# Patient Record
Sex: Female | Born: 1945 | Race: White | Hispanic: No | Smoking: Former smoker
Health system: Southern US, Community
[De-identification: ages and names within clinical notes are randomized; demographics above are authoritative.]

## PROBLEM LIST (undated history)

## (undated) DIAGNOSIS — I10 Essential (primary) hypertension: Secondary | ICD-10-CM

## (undated) DIAGNOSIS — K219 Gastro-esophageal reflux disease without esophagitis: Secondary | ICD-10-CM

## (undated) DIAGNOSIS — R7303 Prediabetes: Secondary | ICD-10-CM

## (undated) DIAGNOSIS — Z9889 Other specified postprocedural states: Secondary | ICD-10-CM

## (undated) DIAGNOSIS — R519 Headache, unspecified: Secondary | ICD-10-CM

## (undated) DIAGNOSIS — R531 Weakness: Secondary | ICD-10-CM

## (undated) DIAGNOSIS — K449 Diaphragmatic hernia without obstruction or gangrene: Secondary | ICD-10-CM

## (undated) DIAGNOSIS — E559 Vitamin D deficiency, unspecified: Secondary | ICD-10-CM

## (undated) DIAGNOSIS — F320 Major depressive disorder, single episode, mild: Secondary | ICD-10-CM

## (undated) DIAGNOSIS — L409 Psoriasis, unspecified: Secondary | ICD-10-CM

## (undated) DIAGNOSIS — G3184 Mild cognitive impairment, so stated: Secondary | ICD-10-CM

## (undated) DIAGNOSIS — N3946 Mixed incontinence: Secondary | ICD-10-CM

## (undated) DIAGNOSIS — Z862 Personal history of diseases of the blood and blood-forming organs and certain disorders involving the immune mechanism: Secondary | ICD-10-CM

## (undated) DIAGNOSIS — M792 Neuralgia and neuritis, unspecified: Secondary | ICD-10-CM

## (undated) DIAGNOSIS — E7800 Pure hypercholesterolemia, unspecified: Secondary | ICD-10-CM

## (undated) DIAGNOSIS — F988 Other specified behavioral and emotional disorders with onset usually occurring in childhood and adolescence: Secondary | ICD-10-CM

## (undated) DIAGNOSIS — R413 Other amnesia: Secondary | ICD-10-CM

## (undated) HISTORY — DX: Diaphragmatic hernia without obstruction or gangrene: K44.9

## (undated) HISTORY — DX: Gastro-esophageal reflux disease without esophagitis: K21.9

## (undated) HISTORY — DX: Other specified behavioral and emotional disorders with onset usually occurring in childhood and adolescence: F98.8

## (undated) HISTORY — DX: Headache, unspecified: R51.9

## (undated) HISTORY — DX: Vitamin D deficiency, unspecified: E55.9

## (undated) HISTORY — DX: Major depressive disorder, single episode, mild: F32.0

## (undated) HISTORY — DX: Essential (primary) hypertension: I10

## (undated) HISTORY — DX: Prediabetes: R73.03

## (undated) HISTORY — DX: Morbid (severe) obesity due to excess calories: E66.01

## (undated) HISTORY — DX: Mild cognitive impairment of uncertain or unknown etiology: G31.84

## (undated) HISTORY — DX: Mixed incontinence: N39.46

## (undated) HISTORY — DX: Psoriasis, unspecified: L40.9

## (undated) HISTORY — DX: Neuralgia and neuritis, unspecified: M79.2

## (undated) HISTORY — DX: Pure hypercholesterolemia, unspecified: E78.00

## (undated) HISTORY — DX: Weakness: R53.1

## (undated) HISTORY — DX: Personal history of diseases of the blood and blood-forming organs and certain disorders involving the immune mechanism: Z86.2

## (undated) HISTORY — DX: Other amnesia: R41.3

## (undated) HISTORY — DX: Other specified postprocedural states: Z98.890

---

## 2004-09-01 ENCOUNTER — Ambulatory Visit: Payer: Self-pay | Admitting: Family Medicine

## 2005-12-03 ENCOUNTER — Ambulatory Visit: Payer: Self-pay | Admitting: Gastroenterology

## 2006-01-29 ENCOUNTER — Ambulatory Visit: Payer: Self-pay | Admitting: Neurology

## 2006-05-04 ENCOUNTER — Ambulatory Visit (HOSPITAL_COMMUNITY): Admission: RE | Admit: 2006-05-04 | Discharge: 2006-05-05 | Payer: Self-pay | Admitting: Neurosurgery

## 2007-06-07 ENCOUNTER — Ambulatory Visit: Payer: Self-pay | Admitting: Family Medicine

## 2007-06-10 ENCOUNTER — Encounter: Admission: RE | Admit: 2007-06-10 | Discharge: 2007-06-10 | Payer: Self-pay | Admitting: Neurosurgery

## 2007-08-11 ENCOUNTER — Ambulatory Visit: Payer: Self-pay | Admitting: Family Medicine

## 2010-08-14 ENCOUNTER — Ambulatory Visit: Payer: Self-pay | Admitting: Family Medicine

## 2010-11-28 NOTE — Op Note (Signed)
NAMEJohnette Griffith           ACCOUNT NO.:  [id number]   MEDICAL RECORD NO.:  [id number]          PATIENT TYPE:  AMB   LOCATION:  SDS                          FACILITY:  [facility]   PHYSICIAN:  Reinaldo Meeker, M.D. 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   PROCEDURE:  C3-4 and C4-5 anterior cervical diskectomy with bone bank fusion  followed by Accufix anterior cervical plating.   SURGEON:  Reinaldo Meeker, M.D.   ASSISTANT:  Julio Sicks, M.D.   PROCEDURE IN DETAIL:  After being placed in the supine position and 5 pounds  of halter traction, the patient's neck was prepped and draped in the usual  sterile fashion.  Localizing fluoroscopy was used prior to incision to  identify the appropriate level.  A transverse incision was made in the right  anterior neck starting at the midline and headed towards the medial aspect  of the sternocleidomastoid muscle.  The platysma muscle was then incised  transversely.  The natural fascial plane between the strap muscles medially  and sternocleidomastoid laterally was identified and followed down to the  anterior aspect of the cervical spine.  Longus coli muscles were identified,  split the midline and stripped away bilaterally with Building surveyor.  A second x-ray showed approach to the appropriate level.  Using the 15 blade, the annuli of disks at C3-4 and C4-5 were incised.  Using pituitary rongeurs and curettes, approximately 90% of the disk  material was removed.  A high-speed drill was used to widen the interspace  at both levels.  Microscope was draped, brought into the field and used for  the remainder of the case.  Using microsection technique, the remainder  of  the disk material along the posterior longitudinal ligament was removed.  The ligament was then incised transversely and the cut edges were removed  with a Kerrison punch.  Thorough spinal decompression was carried out as  well as proximal foraminal decompression bilaterally.  Attention was then  turned to C4-5, where a similar decompression was completed.  At this time,  inspection was carried out at both levels for any evidence of residual  compression and none could be identified.  Spinal dura was well  decompressed; proximal foraminal nerve roots were identified as well.  At  this time, large amounts of irrigation were carried out and any bleeding  controlled with bipolar coagulation and Gelfoam.  Measurements were taken  and 7-mm and 6-mm bone bank plugs were reconstituted.  After irrigating once  more and confirming hemostasis, the plugs were impacted with the 7-mm plug  at C3-4 and the 6-mm plug at C4-5.  Fluoroscopy showed them to be in good  position.  An appropriate-length Accufix anterior cervical plate was then  chosen.  Under fluoroscopic guidance, drill holes were placed followed by  placement of 13-mm  screws x6.  Locking mechanism was well secured and  fluoroscopy showed the plate, screws and plugs to all be in good position.  Large amounts of irrigation were carried out and any bleeding at this time  was controlled with bipolar coagulation.  The wound was then closed  using interrupted Vicryl on the platysma muscle, inverted 5-0 PDS on the  subcuticular layer and Steri-Strips on the skin.  A sterile dressing was  then applied and the patient was extubated and taken to the recovery room in  stable condition.           ______________________________  Reinaldo Meeker, M.D.     ROK/MEDQ  D:  05/04/2006  T:  05/05/2006  Job:  [id number]

## 2011-03-31 DIAGNOSIS — I10 Essential (primary) hypertension: Secondary | ICD-10-CM | POA: Insufficient documentation

## 2011-03-31 DIAGNOSIS — E559 Vitamin D deficiency, unspecified: Secondary | ICD-10-CM | POA: Insufficient documentation

## 2011-10-13 DIAGNOSIS — Z96659 Presence of unspecified artificial knee joint: Secondary | ICD-10-CM | POA: Insufficient documentation

## 2012-11-03 DIAGNOSIS — Z862 Personal history of diseases of the blood and blood-forming organs and certain disorders involving the immune mechanism: Secondary | ICD-10-CM | POA: Insufficient documentation

## 2012-11-03 DIAGNOSIS — D649 Anemia, unspecified: Secondary | ICD-10-CM | POA: Insufficient documentation

## 2015-09-04 DIAGNOSIS — E7800 Pure hypercholesterolemia, unspecified: Secondary | ICD-10-CM | POA: Insufficient documentation

## 2015-09-06 ENCOUNTER — Other Ambulatory Visit: Payer: Self-pay | Admitting: Family Medicine

## 2015-09-06 DIAGNOSIS — Z1231 Encounter for screening mammogram for malignant neoplasm of breast: Secondary | ICD-10-CM

## 2015-09-06 DIAGNOSIS — Z780 Asymptomatic menopausal state: Secondary | ICD-10-CM

## 2015-09-11 ENCOUNTER — Ambulatory Visit
Admission: RE | Admit: 2015-09-11 | Discharge: 2015-09-11 | Disposition: A | Discharge status: Home / Self Care | Payer: Medicare Other | Source: Ambulatory Visit | Attending: Family Medicine | Admitting: Family Medicine

## 2015-09-11 ENCOUNTER — Ambulatory Visit: Payer: Self-pay

## 2015-09-11 DIAGNOSIS — Z1231 Encounter for screening mammogram for malignant neoplasm of breast: Secondary | ICD-10-CM | POA: Insufficient documentation

## 2015-09-11 DIAGNOSIS — Z780 Asymptomatic menopausal state: Secondary | ICD-10-CM | POA: Insufficient documentation

## 2015-09-11 DIAGNOSIS — N63 Unspecified lump in breast: Secondary | ICD-10-CM | POA: Diagnosis not present

## 2015-09-18 ENCOUNTER — Other Ambulatory Visit: Payer: Self-pay | Admitting: Family Medicine

## 2015-09-18 DIAGNOSIS — R928 Other abnormal and inconclusive findings on diagnostic imaging of breast: Secondary | ICD-10-CM

## 2015-09-20 ENCOUNTER — Ambulatory Visit
Admission: RE | Admit: 2015-09-20 | Discharge: 2015-09-20 | Disposition: A | Discharge status: Home / Self Care | Payer: Medicare Other | Source: Ambulatory Visit | Attending: Family Medicine | Admitting: Family Medicine

## 2015-09-20 DIAGNOSIS — N63 Unspecified lump in breast: Secondary | ICD-10-CM | POA: Diagnosis not present

## 2015-09-20 DIAGNOSIS — R928 Other abnormal and inconclusive findings on diagnostic imaging of breast: Secondary | ICD-10-CM

## 2016-11-23 DIAGNOSIS — J3089 Other allergic rhinitis: Secondary | ICD-10-CM | POA: Insufficient documentation

## 2016-12-24 DIAGNOSIS — K449 Diaphragmatic hernia without obstruction or gangrene: Secondary | ICD-10-CM | POA: Insufficient documentation

## 2017-07-02 DIAGNOSIS — B001 Herpesviral vesicular dermatitis: Secondary | ICD-10-CM | POA: Insufficient documentation

## 2017-12-14 DIAGNOSIS — F320 Major depressive disorder, single episode, mild: Secondary | ICD-10-CM | POA: Insufficient documentation

## 2018-12-19 DIAGNOSIS — N3941 Urge incontinence: Secondary | ICD-10-CM | POA: Insufficient documentation

## 2019-07-21 DIAGNOSIS — M792 Neuralgia and neuritis, unspecified: Secondary | ICD-10-CM | POA: Insufficient documentation

## 2019-09-19 DIAGNOSIS — G3184 Mild cognitive impairment, so stated: Secondary | ICD-10-CM | POA: Insufficient documentation

## 2020-09-06 DIAGNOSIS — R519 Headache, unspecified: Secondary | ICD-10-CM | POA: Insufficient documentation

## 2020-09-06 DIAGNOSIS — R531 Weakness: Secondary | ICD-10-CM | POA: Insufficient documentation

## 2020-09-11 ENCOUNTER — Other Ambulatory Visit: Payer: Self-pay | Admitting: Neurology

## 2020-09-11 DIAGNOSIS — R413 Other amnesia: Secondary | ICD-10-CM

## 2020-09-11 DIAGNOSIS — R519 Headache, unspecified: Secondary | ICD-10-CM

## 2020-09-11 DIAGNOSIS — R531 Weakness: Secondary | ICD-10-CM

## 2020-09-24 ENCOUNTER — Ambulatory Visit
Admission: RE | Admit: 2020-09-24 | Discharge: 2020-09-24 | Disposition: A | Discharge status: Home / Self Care | Payer: Medicare HMO | Source: Ambulatory Visit | Attending: Neurology | Admitting: Neurology

## 2020-09-24 ENCOUNTER — Other Ambulatory Visit: Payer: Self-pay

## 2020-09-24 DIAGNOSIS — R519 Headache, unspecified: Secondary | ICD-10-CM

## 2020-09-24 DIAGNOSIS — R413 Other amnesia: Secondary | ICD-10-CM

## 2020-09-27 ENCOUNTER — Ambulatory Visit: Payer: Self-pay

## 2020-11-04 DIAGNOSIS — F39 Unspecified mood [affective] disorder: Secondary | ICD-10-CM | POA: Insufficient documentation

## 2020-12-02 DIAGNOSIS — F988 Other specified behavioral and emotional disorders with onset usually occurring in childhood and adolescence: Secondary | ICD-10-CM | POA: Insufficient documentation

## 2020-12-02 DIAGNOSIS — M170 Bilateral primary osteoarthritis of knee: Secondary | ICD-10-CM | POA: Insufficient documentation

## 2020-12-02 DIAGNOSIS — M2550 Pain in unspecified joint: Secondary | ICD-10-CM | POA: Insufficient documentation

## 2020-12-02 DIAGNOSIS — L409 Psoriasis, unspecified: Secondary | ICD-10-CM | POA: Insufficient documentation

## 2021-05-29 IMAGING — MR MR HEAD W/O CM
11 series · 45 of 48 positions shown · non-contrast
Comparison: Head CT 06/07/2007.  Brain MRI 02/04/2006.

CLINICAL DATA: Intractable headache, unspecified chronicity
pattern, unspecified headache type. Memory difficulties. Additional
history provided by scanning technologist: Patient reports daily
headaches which are constant but very in intensity over the past 2-3
months, "red ridge" on scalp that is sensitive to touch for the past
6 months, 2 years of right facial pain.

EXAM:
MRI HEAD WITHOUT CONTRAST
TECHNIQUE: Multiplanar, multiecho pulse sequences of the brain and surrounding
structures were obtained without intravenous contrast.

[Series 5: ax dwi_tracew · axial · 3.0mm · 0.65mm/px · z∈[-96,+59]mm · 4 of 48 slices shown]
[im 1/48]
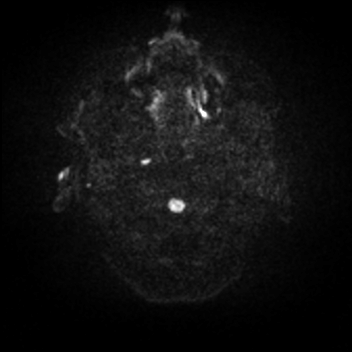
[im 16/48]
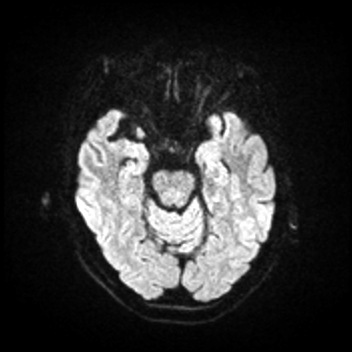
[im 32/48]
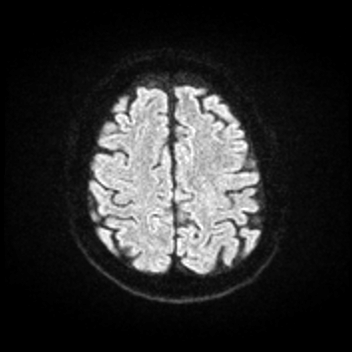
[im 48/48]
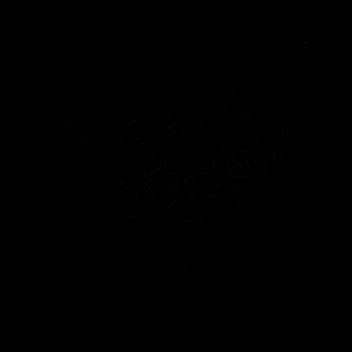

[Series 6: ax dwi_adc · axial · 3.0mm · 0.65mm/px · z∈[-96,+42]mm · 4 of 43 slices shown]
[im 1/43]
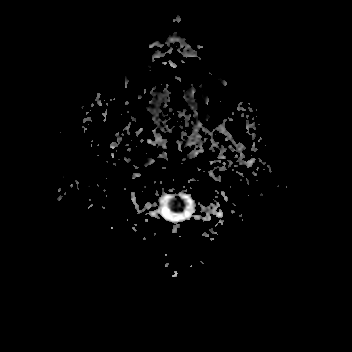
[im 15/43]
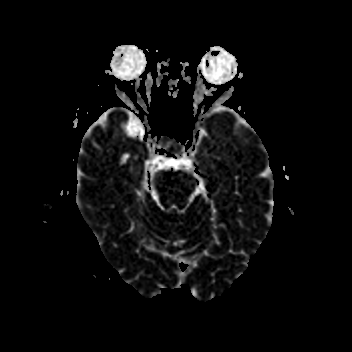
[im 29/43]
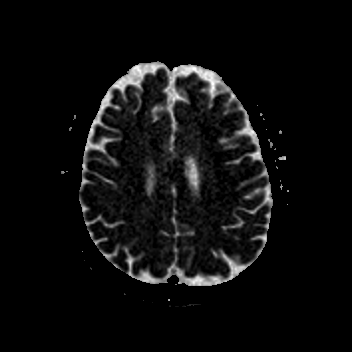
[im 43/43]
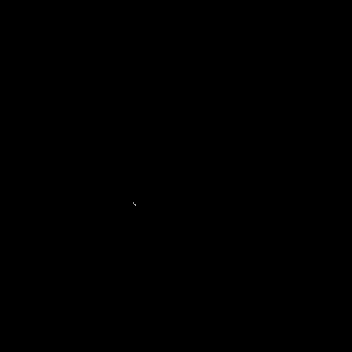

[Series 7: cor dwi_tracew · coronal · 5.0mm · 0.65mm/px · 3 of 40 slices shown]
[im 1/40]
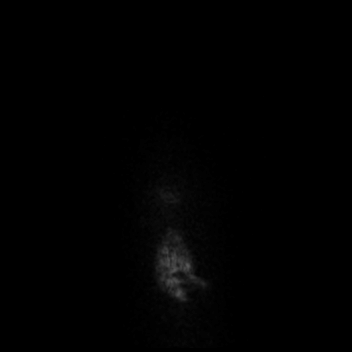
[im 20/40]
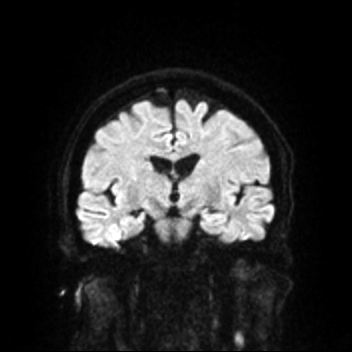
[im 40/40]
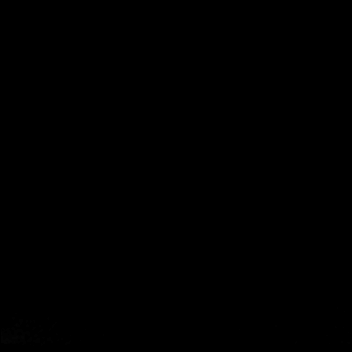

[Series 8: cor dwi_adc · coronal · 5.0mm · 0.65mm/px · 3 of 35 slices shown]
[im 1/35]
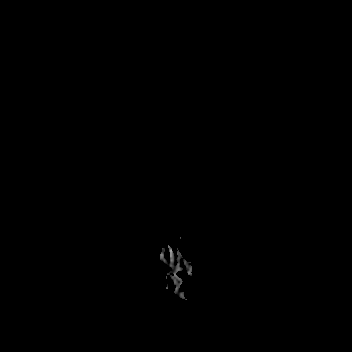
[im 18/35]
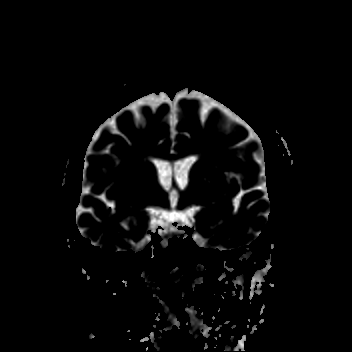
[im 35/35]
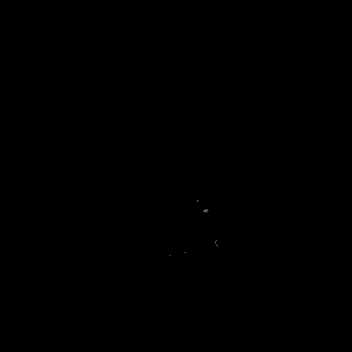

[Series 9: T1 · sagittal · 5.0mm · 0.62mm/px · 2 of 25 slices shown (1 of 2)]
[im 1/25]
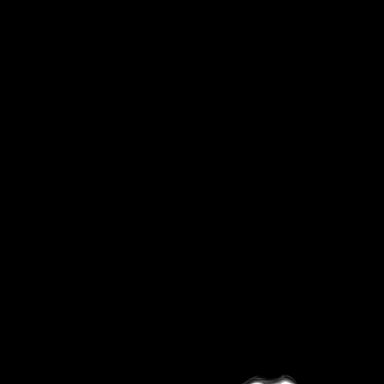
[im 25/25]
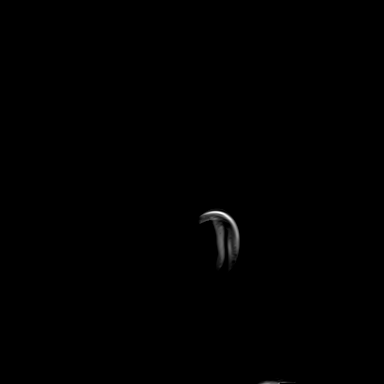

[Series 10: T2 · axial · 5.0mm · 0.53mm/px · z∈[-90,+54]mm · 2 of 25 slices shown (1 of 2)]
[im 1/25]
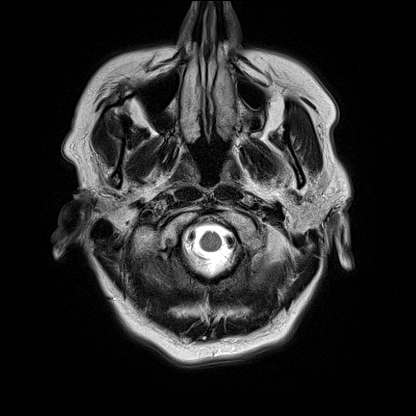
[im 25/25]
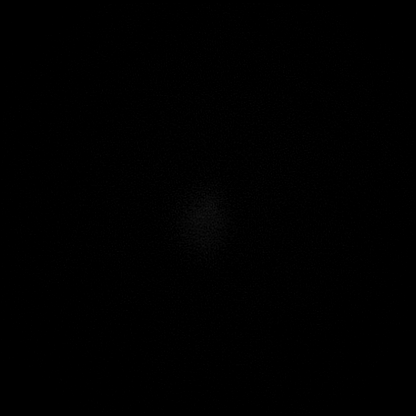

[Series 12: pha_images · axial · 3.0mm · 0.90mm/px · z∈[-107,+64]mm · 5 of 57 slices shown]
[im 1/57]
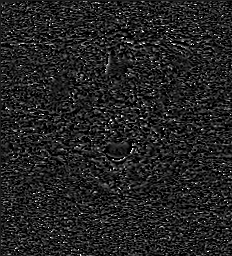
[im 15/57]
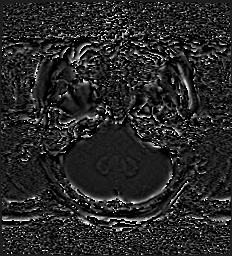
[im 29/57]
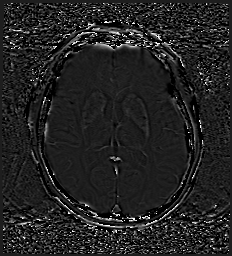
[im 43/57]
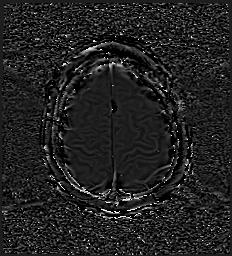
[im 57/57]
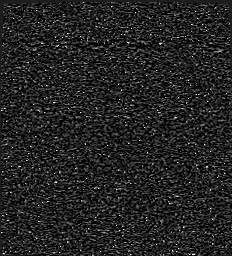

[Series 13: swi_images · axial · 3.0mm · 0.90mm/px · z∈[-107,+70]mm · 5 of 60 slices shown]
[im 1/60]
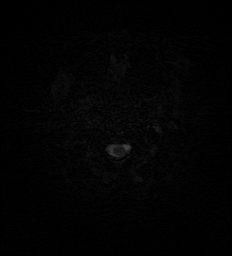
[im 15/60]
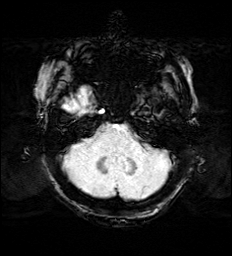
[im 30/60]
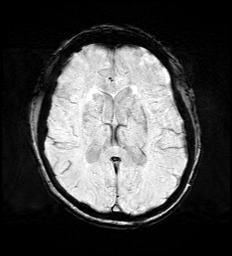
[im 45/60]
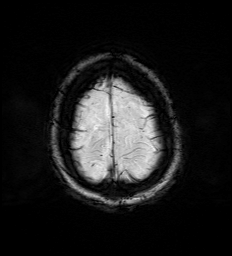
[im 60/60]
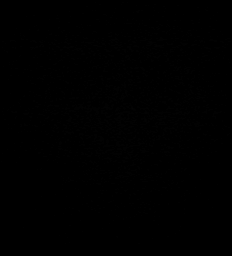

[Series 15: FLAIR · axial · 3.0mm · 0.53mm/px · z∈[-99,+63]mm · 4 of 55 slices shown]
[im 1/55]
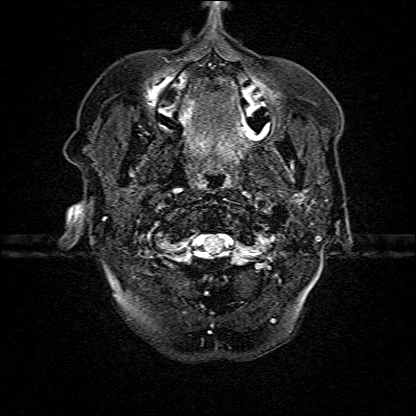
[im 19/55]
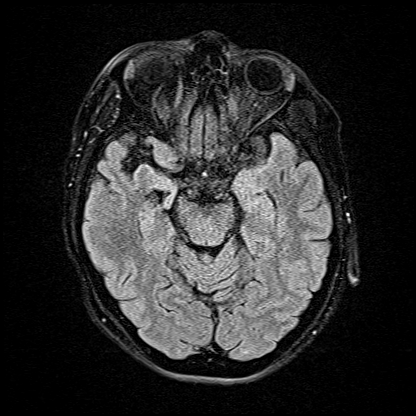
[im 37/55]
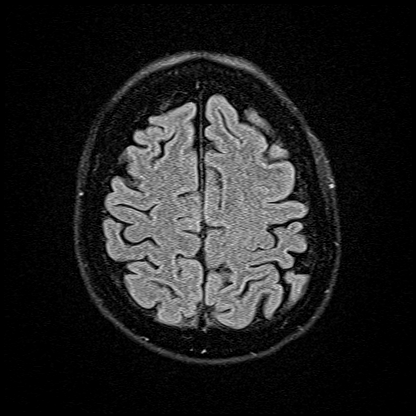
[im 55/55]
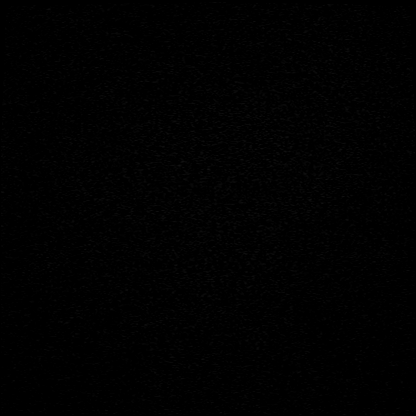

[Series 16: T1 · axial · 1.0mm · 0.98mm/px · z∈[-106,+66]mm · 11 of 171 slices shown (2 of 2)]
[im 1/171]
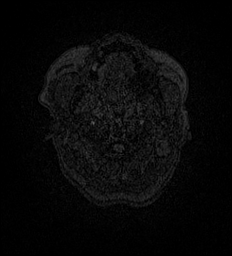
[im 14/171]
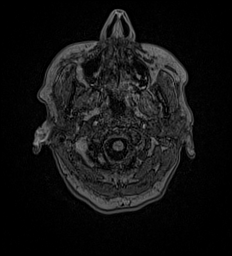
[im 27/171]
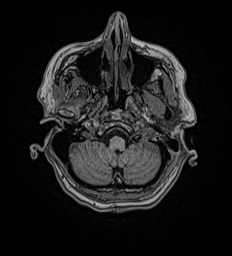
[im 40/171]
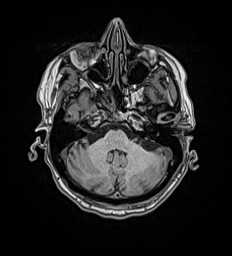
[im 53/171]
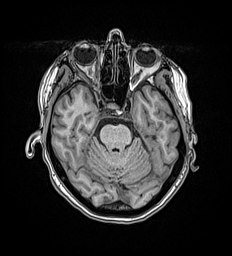
[im 66/171]
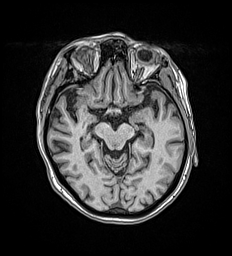
[im 79/171]
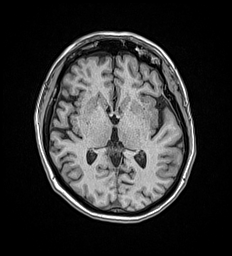
[im 92/171]
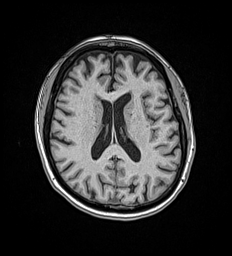
[im 118/171]
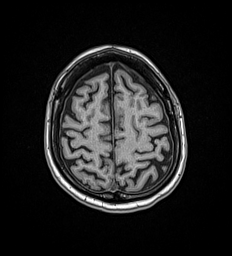
[im 144/171]
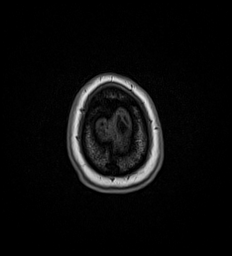
[im 171/171]
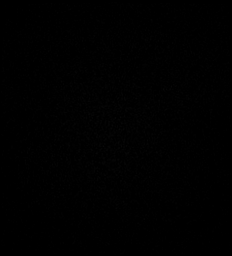

[Series 17: T2 · coronal · 5.0mm · 0.57mm/px · 2 of 29 slices shown (2 of 2)]
[im 1/29]
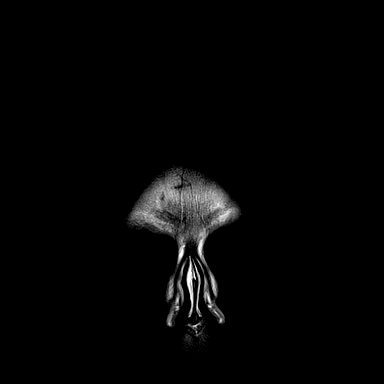
[im 29/29]
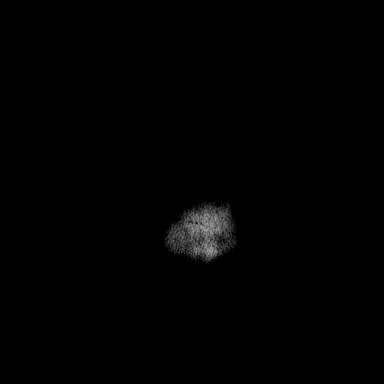

[45 of 48 positions shown; findings below may reference images not displayed]

FINDINGS: Brain:

Mild cerebral atrophy without appreciable lobar predominance.

Mild multifocal T2/FLAIR hyperintensity within the bilateral
cerebral white matter (most notably within the right greater than
left deep frontal white matter), nonspecific but likely reflecting
chronic small vessel ischemic disease.

There is no acute infarct.

No evidence of intracranial mass.

No chronic intracranial blood products.

No extra-axial fluid collection.

No midline shift.

Vascular: Expected proximal arterial flow voids.

Skull and upper cervical spine: No focal marrow lesion.
Susceptibility artifact arising from cervical spinal fusion
hardware.

Sinuses/Orbits: Visualized orbits show no acute finding. Mild to
moderate right sphenoid sinus mucosal thickening.
IMPRESSION: No evidence of acute intracranial abnormality.

Mild multifocal T2/FLAIR hyperintensity within the cerebral white
matter (most notably within the right greater than left deep frontal
white matter), nonspecific but likely reflecting chronic small
vessel ischemic disease. Findings have slightly progressed as
compared to the brain MRI of 01/29/2006.

Progressive mild generalized cerebral atrophy.

Mild to moderate right sphenoid sinus mucosal thickening.

## 2021-09-03 DIAGNOSIS — Z9889 Other specified postprocedural states: Secondary | ICD-10-CM | POA: Insufficient documentation

## 2021-12-11 DIAGNOSIS — R7303 Prediabetes: Secondary | ICD-10-CM | POA: Insufficient documentation

## 2022-09-28 ENCOUNTER — Other Ambulatory Visit: Payer: Self-pay | Admitting: Student

## 2022-09-28 DIAGNOSIS — R200 Anesthesia of skin: Secondary | ICD-10-CM

## 2022-09-28 DIAGNOSIS — R202 Paresthesia of skin: Secondary | ICD-10-CM

## 2022-09-28 DIAGNOSIS — R413 Other amnesia: Secondary | ICD-10-CM

## 2022-10-08 ENCOUNTER — Encounter: Payer: Self-pay | Admitting: Student

## 2022-10-10 ENCOUNTER — Ambulatory Visit
Admission: RE | Admit: 2022-10-10 | Discharge: 2022-10-10 | Disposition: A | Discharge status: Home / Self Care | Payer: Medicare HMO | Source: Ambulatory Visit | Attending: Student | Admitting: Student

## 2022-10-10 DIAGNOSIS — R202 Paresthesia of skin: Secondary | ICD-10-CM

## 2022-10-10 DIAGNOSIS — R200 Anesthesia of skin: Secondary | ICD-10-CM

## 2022-10-10 DIAGNOSIS — R413 Other amnesia: Secondary | ICD-10-CM

## 2023-02-03 ENCOUNTER — Ambulatory Visit (INDEPENDENT_AMBULATORY_CARE_PROVIDER_SITE_OTHER): Payer: Medicare HMO

## 2023-02-03 ENCOUNTER — Ambulatory Visit
Admission: EM | Admit: 2023-02-03 | Discharge: 2023-02-03 | Disposition: A | Discharge status: Home / Self Care | Payer: Medicare HMO | Attending: Emergency Medicine | Admitting: Emergency Medicine

## 2023-02-03 DIAGNOSIS — R0602 Shortness of breath: Secondary | ICD-10-CM

## 2023-02-03 DIAGNOSIS — J0140 Acute pansinusitis, unspecified: Secondary | ICD-10-CM

## 2023-02-03 DIAGNOSIS — J22 Unspecified acute lower respiratory infection: Secondary | ICD-10-CM

## 2023-02-03 MED ORDER — HYDROCOD POLI-CHLORPHE POLI ER 10-8 MG/5ML PO SUER: 5.0000 mL | Freq: Two times a day (BID) | ORAL | 0 refills | Status: AC | PRN

## 2023-02-03 MED ORDER — IPRATROPIUM-ALBUTEROL 0.5-2.5 (3) MG/3ML IN SOLN
3.0000 mL | Freq: Once | RESPIRATORY_TRACT | Status: AC
  Administered 2023-02-03: 3 mL via RESPIRATORY_TRACT

## 2023-02-03 MED ORDER — AEROCHAMBER MV MISC: 1 refills | Status: AC

## 2023-02-03 MED ORDER — AMOXICILLIN-POT CLAVULANATE 875-125 MG PO TABS: 1.0000 | ORAL_TABLET | Freq: Two times a day (BID) | ORAL | 0 refills | Status: AC

## 2023-02-03 MED ORDER — PREDNISONE 20 MG PO TABS: 40.0000 mg | ORAL_TABLET | Freq: Every day | ORAL | 0 refills | Status: AC

## 2023-02-03 MED ORDER — ALBUTEROL SULFATE (2.5 MG/3ML) 0.083% IN NEBU: 2.5000 mg | INHALATION_SOLUTION | RESPIRATORY_TRACT | 0 refills | Status: AC | PRN

## 2023-02-03 MED ORDER — PREDNISONE 50 MG PO TABS
60.0000 mg | ORAL_TABLET | Freq: Once | ORAL | Status: AC
  Administered 2023-02-03: 60 mg via ORAL

## 2023-02-03 MED ORDER — ALBUTEROL SULFATE HFA 108 (90 BASE) MCG/ACT IN AERS: 1.0000 | INHALATION_SPRAY | RESPIRATORY_TRACT | 0 refills | Status: AC | PRN

## 2023-02-03 NOTE — Discharge Instructions (Addendum)
May start the steroids tomorrow.  Decrease Flonase to 1 time a day.  2 puffs from your albuterol inhaler or nebulizer treatment every 4 hours for 2 days, and then every 6 hours for 2 days, then as needed.  Can back off on the albuterol if you start to improve sooner.  Finish the prednisone and Augmentin, even if you feel better the Augmentin will cover both sinusitis and pneumonia the not have shown up on x-ray.  Tussionex as needed for cough.  I have also given you albuterol nebulizer solution if the inhaler with a spacer is not working.  With your primary care provider in several days to make sure that you are getting better.  Go to the ER for the signs and symptoms we discussed.

## 2023-02-03 NOTE — ED Triage Notes (Signed)
Pt c/o cough,wheezing & chest congestion x1 wk. Denies any fevers,hx of asthma or COPD. Has tried alka seltzer,robitussin & vicks w/o relief.

## 2023-02-03 NOTE — ED Provider Notes (Signed)
HPI  SUBJECTIVE:  Jessica Griffith is a 77 y.o. female who presents with 6 days of a cough productive of dark yellow-green sputum, postnasal drip, sore throat, headache, wheezing, diffuse chest tightness, upper abdominal soreness from the coughing.  She reports dyspnea on exertion and is unable to sleep at night because of the cough.  No fevers, nasal congestion, rhinorrhea, sinus pain or pressure, shortness of breath at rest.  No lower extremity edema, unintentional weight gain, nocturia, orthopnea.  No antibiotics in the past 3 months.  No antipyretic in the past 6 hours.  She has tried cough drops, over-the-counter cough syrups, Alka-Seltzer cold and flu plus, Flonase 3 times daily.  Flonase and cough drops help very temporarily.  Symptoms are worse with lying down.  She denies GERD symptoms.  She had 2 negative home COVID tests.  She states that she is getting worse.  Her grandson, who stays with her, was diagnosed with pneumonia last week.  She has a past medical history of well-controlled hypertension, she quit smoking 40 years ago and has a history of GERD.  No history of pulmonary disease, diabetes, chronic kidney disease.  She states she has a nebulizer machine at home from her grandson.    Past Medical History:  Diagnosis Date   ADD (attention deficit disorder)    Current mild episode of major depressive disorder without prior episode (HCC)    Essential hypertension    GERD (gastroesophageal reflux disease)    Headache disorder    Hiatal hernia    Hx of iron deficiency anemia    Hypercholesterolemia    Memory difficulties    Mild cognitive impairment    Mixed stress and urge urinary incontinence    Neuropathic pain, leg, left    Prediabetes    Psoriasis    S/P laparoscopic fundoplication    Severe obesity (BMI 35.0-39.9) with comorbidity (HCC)    Vitamin D deficiency    Weakness     History reviewed. No pertinent surgical history.  History reviewed. No pertinent family  history.  Social History   Tobacco Use   Smoking status: Former    Average packs/day: 0.5 packs/day for 13.0 years (6.5 ttl pk-yrs)    Types: Cigarettes    Start date: 10/27/1958   Smokeless tobacco: Never  Vaping Use   Vaping status: Never Used  Substance Use Topics   Alcohol use: Not Currently   Drug use: Never    No current facility-administered medications for this encounter.  Current Outpatient Medications:    acyclovir (ZOVIRAX) 400 MG tablet, Take 1 tablet by mouth 3 (three) times daily., Disp: , Rfl:    Adapalene 0.3 % gel, Apply externally to the affected area every day., Disp: , Rfl:    albuterol (PROVENTIL) (2.5 MG/3ML) 0.083% nebulizer solution, Take 3 mLs (2.5 mg total) by nebulization every 4 (four) hours as needed for wheezing or shortness of breath., Disp: 75 mL, Rfl: 0   albuterol (VENTOLIN HFA) 108 (90 Base) MCG/ACT inhaler, Inhale 1-2 puffs into the lungs every 4 (four) hours as needed for wheezing or shortness of breath., Disp: 1 each, Rfl: 0   amoxicillin-clavulanate (AUGMENTIN) 875-125 MG tablet, Take 1 tablet by mouth every 12 (twelve) hours for 5 days., Disp: 10 tablet, Rfl: 0   chlorpheniramine-HYDROcodone (TUSSIONEX) 10-8 MG/5ML, Take 5 mLs by mouth every 12 (twelve) hours as needed for cough., Disp: 60 mL, Rfl: 0   clindamycin (CLEOCIN T) 1 % lotion, Apply topically., Disp: , Rfl:  esomeprazole (NEXIUM) 20 MG capsule, Take 20 mg by mouth daily., Disp: , Rfl:    fluticasone (FLONASE) 50 MCG/ACT nasal spray, Place into the nose., Disp: , Rfl:    gabapentin (NEURONTIN) 100 MG capsule, Take gabapentin 300 mg twice daily for 1 week, then increase to 400 mg twice daily and continue this dose., Disp: , Rfl:    hydrochlorothiazide (HYDRODIURIL) 25 MG tablet, Take 1 tablet by mouth daily., Disp: , Rfl:    omeprazole (PRILOSEC) 10 MG capsule, Take 1 tablet by mouth daily., Disp: , Rfl:    oxybutynin (DITROPAN XL) 15 MG 24 hr tablet, Take 1 tablet by mouth daily.,  Disp: , Rfl:    predniSONE (DELTASONE) 20 MG tablet, Take 2 tablets (40 mg total) by mouth daily with breakfast for 5 days., Disp: 10 tablet, Rfl: 0   rizatriptan (MAXALT) 10 MG tablet, TAKE 1 TAB ONCE AS NEEDED FOR MIGRAINE FOR UP TO 1 DOSE MAY TAKE A SECOND DOSE AFTER 2 HRS IF NEEDED, Disp: , Rfl:    simvastatin (ZOCOR) 10 MG tablet, Take by mouth., Disp: , Rfl:    Spacer/Aero-Holding Chambers (AEROCHAMBER MV) inhaler, Use as instructed, Disp: 1 each, Rfl: 1   traZODone (DESYREL) 50 MG tablet, Take 50 mg by mouth at bedtime., Disp: , Rfl:    triamcinolone ointment (KENALOG) 0.1 %, Apply topically., Disp: , Rfl:    venlafaxine (EFFEXOR) 37.5 MG tablet, Take 37.5 mg by mouth 2 (two) times daily., Disp: , Rfl:   Allergies  Allergen Reactions   Amlodipine Swelling   Atorvastatin Swelling    LE swelling   Levofloxacin Other (See Comments)    Other Reaction: KNEE PAIN   Lisinopril Cough     ROS  As noted in HPI.   Physical Exam  BP 129/80 (BP Location: Left Arm)   Pulse 81   Temp 98.4 F (36.9 C) (Oral)   Resp 16   Ht 5\' 4"  (1.626 m)   Wt 90.7 kg   SpO2 94%   BMI 34.33 kg/m   O2 sat on my read 91 to 92%.  Constitutional: Well developed, well nourished, no acute distress Eyes:  EOMI, conjunctiva normal bilaterally HENT: Normocephalic, atraumatic,mucus membranes moist.  Mild nasal congestion, erythematous but not swollen turbinates, positive frontal and mild maxillary sinus tenderness.  Normal oropharynx.  No appreciable postnasal drip. Respiratory: Normal inspiratory effort, fair air movement.  Lungs clear.  Positive anterior chest wall tenderness. Cardiovascular: Normal rate, regular rhythm, no murmurs rubs or gallops GI: nondistended, positive mild upper abdominal muscular tenderness. skin: No rash, skin intact Musculoskeletal: no deformities Neurologic: Alert & oriented x 3, no focal neuro deficits Psychiatric: Speech and behavior appropriate   ED  Course   Medications  ipratropium-albuterol (DUONEB) 0.5-2.5 (3) MG/3ML nebulizer solution 3 mL (3 mLs Nebulization Given 02/03/23 1123)  predniSONE (DELTASONE) tablet 60 mg (60 mg Oral Given 02/03/23 1123)    Orders Placed This Encounter  Procedures   DG Chest 2 View    Standing Status:   Standing    Number of Occurrences:   1    Order Specific Question:   Reason for Exam (SYMPTOM  OR DIAGNOSIS REQUIRED)    Answer:   Cough, shortness of breath, rule out pneumonia, pulmonary edema, pleural effusion    No results found for this or any previous visit (from the past 24 hour(s)). DG Chest 2 View  Result Date: 02/03/2023 CLINICAL DATA:  Cough and shortness of breath.  Question pneumonia. EXAM: CHEST -  2 VIEW COMPARISON:  None Available. FINDINGS: Heart and mediastinal shadows are normal. There is central bronchial thickening but no infiltrate, collapse or effusion. Ordinary degenerative changes affect the spine. IMPRESSION: Bronchitis pattern. No consolidation or collapse. Electronically Signed   By: Paulina Fusi M.D.   On: 02/03/2023 12:02    ED Clinical Impression  1. Acute non-recurrent pansinusitis      ED Assessment/Plan     Patient is satting 91 to 92% on room air while I interviewed her.  Concern for pneumonia.  Checking chest x-ray.  Will give 60 mg of prednisone and a DuoNeb here.  She also has frontal and some mild maxillary sinus tenderness concerning for sinusitis.  Will send home with Augmentin p.o. twice daily for 7 days which will cover sinusitis in addition to a pneumonia.  Advised patient to decrease Flonase to 1 time daily.  Will send home with regularly scheduled albuterol inhaler with a spacer for 4 days, then as needed thereafter, prednisone 40 mg for 5 days, Tussionex.  Albuterol ampules for the nebulizer if the inhaler with a spacer is not working.  [place] Narcotic database reviewed for this patient, and feel that the risk/benefit ratio today is favorable for  proceeding with a prescription for controlled substance.  No opiate prescriptions in 2 years.  Reviewed imaging independently.  Bronchitis pattern per radiology. see radiology report for full details.  No pneumonia as read by radiology.  Bronchitis.  Prednisone, bronchodilators, cough syrup.  Augmentin for the sinusitis.  On reevaluation, patient states she feels better.  Improved air movement.  Lungs still clear.  Repeat oxygen saturation 96%.  Discussed imaging, MDM, treatment plan, and plan for follow-up with patient. Discussed sn/sx that should prompt return to the ED. patient agrees with plan.   Meds ordered this encounter  Medications   ipratropium-albuterol (DUONEB) 0.5-2.5 (3) MG/3ML nebulizer solution 3 mL   predniSONE (DELTASONE) tablet 60 mg   amoxicillin-clavulanate (AUGMENTIN) 875-125 MG tablet    Sig: Take 1 tablet by mouth every 12 (twelve) hours for 5 days.    Dispense:  10 tablet    Refill:  0   chlorpheniramine-HYDROcodone (TUSSIONEX) 10-8 MG/5ML    Sig: Take 5 mLs by mouth every 12 (twelve) hours as needed for cough.    Dispense:  60 mL    Refill:  0   Spacer/Aero-Holding Chambers (AEROCHAMBER MV) inhaler    Sig: Use as instructed    Dispense:  1 each    Refill:  1   albuterol (VENTOLIN HFA) 108 (90 Base) MCG/ACT inhaler    Sig: Inhale 1-2 puffs into the lungs every 4 (four) hours as needed for wheezing or shortness of breath.    Dispense:  1 each    Refill:  0   predniSONE (DELTASONE) 20 MG tablet    Sig: Take 2 tablets (40 mg total) by mouth daily with breakfast for 5 days.    Dispense:  10 tablet    Refill:  0   albuterol (PROVENTIL) (2.5 MG/3ML) 0.083% nebulizer solution    Sig: Take 3 mLs (2.5 mg total) by nebulization every 4 (four) hours as needed for wheezing or shortness of breath.    Dispense:  75 mL    Refill:  0      *This clinic note was created using Scientist, clinical (histocompatibility and immunogenetics). Therefore, there may be occasional mistakes despite careful  proofreading.  ?    Domenick Gong, MD 02/03/23 1302
